# Patient Record
Sex: Female | Born: 1980 | Race: White | Hispanic: No | Marital: Married | Smoking: Never smoker
Health system: Southern US, Community
[De-identification: ages and names within clinical notes are randomized; demographics above are authoritative.]

## PROBLEM LIST (undated history)

## (undated) DIAGNOSIS — K259 Gastric ulcer, unspecified as acute or chronic, without hemorrhage or perforation: Secondary | ICD-10-CM

## (undated) DIAGNOSIS — E119 Type 2 diabetes mellitus without complications: Secondary | ICD-10-CM

## (undated) DIAGNOSIS — E079 Disorder of thyroid, unspecified: Secondary | ICD-10-CM

## (undated) DIAGNOSIS — I10 Essential (primary) hypertension: Secondary | ICD-10-CM

## (undated) HISTORY — PX: TONSILLECTOMY: SUR1361

---

## 2014-03-27 ENCOUNTER — Emergency Department (HOSPITAL_COMMUNITY): Payer: BC Managed Care – PPO

## 2014-03-27 ENCOUNTER — Emergency Department (HOSPITAL_COMMUNITY)
Admission: EM | Admit: 2014-03-27 | Discharge: 2014-03-28 | Disposition: A | Discharge status: Home / Self Care | Payer: BC Managed Care – PPO | Attending: Emergency Medicine | Admitting: Emergency Medicine

## 2014-03-27 ENCOUNTER — Encounter (HOSPITAL_COMMUNITY): Payer: Self-pay

## 2014-03-27 DIAGNOSIS — N200 Calculus of kidney: Secondary | ICD-10-CM | POA: Diagnosis not present

## 2014-03-27 DIAGNOSIS — R000 Tachycardia, unspecified: Secondary | ICD-10-CM | POA: Diagnosis not present

## 2014-03-27 DIAGNOSIS — I10 Essential (primary) hypertension: Secondary | ICD-10-CM | POA: Insufficient documentation

## 2014-03-27 DIAGNOSIS — R739 Hyperglycemia, unspecified: Secondary | ICD-10-CM

## 2014-03-27 DIAGNOSIS — Z791 Long term (current) use of non-steroidal anti-inflammatories (NSAID): Secondary | ICD-10-CM | POA: Diagnosis not present

## 2014-03-27 DIAGNOSIS — N12 Tubulo-interstitial nephritis, not specified as acute or chronic: Secondary | ICD-10-CM

## 2014-03-27 DIAGNOSIS — Z3202 Encounter for pregnancy test, result negative: Secondary | ICD-10-CM | POA: Insufficient documentation

## 2014-03-27 DIAGNOSIS — R42 Dizziness and giddiness: Secondary | ICD-10-CM | POA: Diagnosis not present

## 2014-03-27 DIAGNOSIS — R109 Unspecified abdominal pain: Secondary | ICD-10-CM | POA: Diagnosis present

## 2014-03-27 DIAGNOSIS — N23 Unspecified renal colic: Secondary | ICD-10-CM | POA: Diagnosis not present

## 2014-03-27 DIAGNOSIS — Z79899 Other long term (current) drug therapy: Secondary | ICD-10-CM | POA: Insufficient documentation

## 2014-03-27 DIAGNOSIS — E039 Hypothyroidism, unspecified: Secondary | ICD-10-CM | POA: Diagnosis not present

## 2014-03-27 DIAGNOSIS — E1165 Type 2 diabetes mellitus with hyperglycemia: Secondary | ICD-10-CM | POA: Diagnosis not present

## 2014-03-27 DIAGNOSIS — Z794 Long term (current) use of insulin: Secondary | ICD-10-CM | POA: Insufficient documentation

## 2014-03-27 HISTORY — DX: Type 2 diabetes mellitus without complications: E11.9

## 2014-03-27 HISTORY — DX: Essential (primary) hypertension: I10

## 2014-03-27 HISTORY — DX: Disorder of thyroid, unspecified: E07.9

## 2014-03-27 HISTORY — DX: Gastric ulcer, unspecified as acute or chronic, without hemorrhage or perforation: K25.9

## 2014-03-27 LAB — URINE MICROSCOPIC-ADD ON

## 2014-03-27 LAB — URINALYSIS, ROUTINE W REFLEX MICROSCOPIC
Bilirubin Urine: NEGATIVE
Glucose, UA: >1000 mg/dL — AB
Ketones, ur: NEGATIVE mg/dL
NITRITE: NEGATIVE
PROTEIN: NEGATIVE mg/dL
Specific Gravity, Urine: 1.01 (ref 1.005–1.030)
Urobilinogen, UA: 1 mg/dL (ref 0.0–1.0)
pH: 7.5 (ref 5.0–8.0)

## 2014-03-27 LAB — POC URINE PREG, ED: Preg Test, Ur: NEGATIVE

## 2014-03-27 MED ORDER — SODIUM CHLORIDE 0.9 % IV SOLN
INTRAVENOUS | Status: DC
  Administered 2014-03-28: via INTRAVENOUS

## 2014-03-27 MED ORDER — ONDANSETRON HCL 4 MG/2ML IJ SOLN
4.0000 mg | Freq: Once | INTRAMUSCULAR | Status: AC
  Administered 2014-03-28: 4 mg via INTRAVENOUS

## 2014-03-27 MED ORDER — HYDROMORPHONE HCL 1 MG/ML IJ SOLN
0.5000 mg | Freq: Once | INTRAMUSCULAR | Status: AC
  Administered 2014-03-28: 0.5 mg via INTRAVENOUS

## 2014-03-27 NOTE — ED Provider Notes (Signed)
CSN: [id number]     Arrival date & time 03/27/14  2244 History   First MD Initiated Contact with Patient 03/27/14 2249     Chief Complaint  Patient presents with  . Flank Pain     (Consider location/radiation/quality/duration/timing/severity/associated sxs/prior Treatment) Patient is a 33 y.o. female presenting with flank pain. The history is provided by the patient.  Flank Pain This is a new problem. The current episode started yesterday. The problem occurs constantly. The problem has been gradually worsening. Associated symptoms include abdominal pain, chills, a fever and nausea. Pertinent negatives include no headaches, rash, urinary symptoms or vomiting. Nothing aggravates the symptoms. She has tried NSAIDs for the symptoms. The treatment provided no relief.   Sandra Garner is a 33 y.o. female who presents to the ED with right flank pain. She states that she fell over a month ago and hit the right side of her back and the area that is hurting tonight is in that area. She is not sure that it is related.   Past Medical History  Diagnosis Date  . Diabetes mellitus without complication   . Thyroid disease   . Hypertension   . Multiple gastric ulcers    Past Surgical History  Procedure Laterality Date  . Tonsillectomy     No family history on file. History  Substance Use Topics  . Smoking status: Never Smoker   . Smokeless tobacco: Not on file  . Alcohol Use: No   OB History    No data available     Review of Systems  Constitutional: Positive for fever and chills.  HENT: Negative.   Eyes: Negative for visual disturbance.  Respiratory: Negative.   Cardiovascular: Negative for palpitations.  Gastrointestinal: Positive for nausea, abdominal pain and constipation. Negative for vomiting and diarrhea.  Genitourinary: Positive for flank pain and pelvic pain. Negative for dysuria, urgency, frequency and decreased urine volume.  Musculoskeletal: Positive for back pain.  Skin:  Negative for rash.  Neurological: Positive for light-headedness. Negative for syncope and headaches.  Psychiatric/Behavioral: Negative for confusion. The patient is not nervous/anxious.       Allergies  Review of patient's allergies indicates no known allergies.  Home Medications   Prior to Admission medications   Medication Sig Start Date End Date Taking? Authorizing Provider  citalopram (CELEXA) 40 MG tablet Take 40 mg by mouth daily.   Yes Historical Provider, MD  esomeprazole (NEXIUM) 40 MG capsule Take 40 mg by mouth daily at 12 noon.   Yes Historical Provider, MD  glyBURIDE (DIABETA) 5 MG tablet Take 5 mg by mouth 2 (two) times daily with a meal.   Yes Historical Provider, MD  insulin aspart (NOVOLOG) 100 UNIT/ML injection Inject into the skin 3 (three) times daily before meals.   Yes Historical Provider, MD  levothyroxine (SYNTHROID, LEVOTHROID) 150 MCG tablet Take 150 mcg by mouth daily before breakfast.   Yes Historical Provider, MD  LORazepam (ATIVAN) 0.5 MG tablet Take 0.5 mg by mouth 2 (two) times daily.   Yes Historical Provider, MD  metFORMIN (GLUCOPHAGE) 500 MG tablet Take 1,000 mg by mouth 2 (two) times daily with a meal.   Yes Historical Provider, MD  naproxen (NAPROSYN) 250 MG tablet Take by mouth 2 (two) times daily with a meal.   Yes Historical Provider, MD  sitaGLIPtin (JANUVIA) 25 MG tablet Take 25 mg by mouth daily.   Yes Historical Provider, MD   BP 152/98 mmHg  Pulse 114  Temp(Src) 101.7 F (38.7 C) (  Oral)  Resp 20  Ht 5\' 10"  (1.778 m)  Wt 260 lb (117.935 kg)  BMI 37.31 kg/m2  SpO2 99%  LMP 03/20/2014 (Approximate) Physical Exam  Constitutional: She is oriented to person, place, and time. She appears well-developed and well-nourished.  HENT:  Head: Normocephalic.  Eyes: EOM are normal.  Neck: Neck supple.  Cardiovascular: Regular rhythm.  Tachycardia present.   Pulmonary/Chest: Effort normal and breath sounds normal.  Abdominal: Soft. Bowel sounds  are normal. There is tenderness in the right lower quadrant and suprapubic area. There is no rebound and no guarding.  Right flank pain  Musculoskeletal: Normal range of motion.  Neurological: She is alert and oriented to person, place, and time. No cranial nerve deficit.  Skin: Skin is warm and dry.  Psychiatric: She has a normal mood and affect. Her behavior is normal.  Nursing note and vitals reviewed.  IV NS, Dilaudid 0.5mg , Zofran 4mg . CT renal  ED Course  Procedures  Results for orders placed or performed during the hospital encounter of 03/27/14 (from the past 24 hour(s))  Urinalysis, Routine w reflex microscopic     Status: Abnormal   Collection Time: 03/27/14 11:27 PM  Result Value Ref Range   Color, Urine YELLOW YELLOW   APPearance HAZY (A) CLEAR   Specific Gravity, Urine 1.010 1.005 - 1.030   pH 7.5 5.0 - 8.0   Glucose, UA >1000 (A) NEGATIVE mg/dL   Hgb urine dipstick TRACE (A) NEGATIVE   Bilirubin Urine NEGATIVE NEGATIVE   Ketones, ur NEGATIVE NEGATIVE mg/dL   Protein, ur NEGATIVE NEGATIVE mg/dL   Urobilinogen, UA 1.0 0.0 - 1.0 mg/dL   Nitrite NEGATIVE NEGATIVE   Leukocytes, UA SMALL (A) NEGATIVE  Urine microscopic-add on     Status: Abnormal   Collection Time: 03/27/14 11:27 PM  Result Value Ref Range   Squamous Epithelial / LPF MANY (A) RARE   WBC, UA 11-20 <3 WBC/hpf   RBC / HPF 3-6 <3 RBC/hpf   Bacteria, UA RARE RARE  POC urine preg, [facility])     Status: None   Collection Time: 03/27/14 11:29 PM  Result Value Ref Range   Preg Test, Ur NEGATIVE NEGATIVE  CBC with Differential     Status: Abnormal   Collection Time: 03/27/14 11:49 PM  Result Value Ref Range   WBC 7.6 4.0 - 10.5 K/uL   RBC 4.08 3.87 - 5.11 MIL/uL   Hemoglobin 10.7 (L) 12.0 - 15.0 g/dL   HCT 16.132.7 (L) 09.636.0 - 04.546.0 %   MCV 80.1 78.0 - 100.0 fL   MCH 26.2 26.0 - 34.0 pg   MCHC 32.7 30.0 - 36.0 g/dL   RDW 40.914.0 81.111.5 - 91.415.5 %   Platelets 125 (L) 150 - 400 K/uL   Neutrophils Relative % 61  43 - 77 %   Neutro Abs 4.6 1.7 - 7.7 K/uL   Lymphocytes Relative 32 12 - 46 %   Lymphs Abs 2.4 0.7 - 4.0 K/uL   Monocytes Relative 5 3 - 12 %   Monocytes Absolute 0.4 0.1 - 1.0 K/uL   Eosinophils Relative 2 0 - 5 %   Eosinophils Absolute 0.1 0.0 - 0.7 K/uL   Basophils Relative 0 0 - 1 %   Basophils Absolute 0.0 0.0 - 0.1 K/uL  Comprehensive metabolic panel     Status: Abnormal   Collection Time: 03/27/14 11:49 PM  Result Value Ref Range   Sodium 136 (L) 137 - 147 mEq/L  Potassium 4.0 3.7 - 5.3 mEq/L   Chloride 99 96 - 112 mEq/L   CO2 25 19 - 32 mEq/L   Glucose, Bld 254 (H) 70 - 99 mg/dL   BUN 7 6 - 23 mg/dL   Creatinine, Ser 4.090.62 0.50 - 1.10 mg/dL   Calcium 9.4 8.4 - 81.110.5 mg/dL   Total Protein 7.7 6.0 - 8.3 g/dL   Albumin 3.3 (L) 3.5 - 5.2 g/dL   AST 22 0 - 37 U/L   ALT 25 0 - 35 U/L   Alkaline Phosphatase 94 39 - 117 U/L   Total Bilirubin 0.4 0.3 - 1.2 mg/dL   GFR calc non Af Amer >90 >90 mL/min   GFR calc Af Amer >90 >90 mL/min   Anion gap 12 5 - 15     MDM  Patient awaiting CT results. Dr. Bebe ShaggyWickline assumed care of the patient @ [id number].     [address] Resorts[address] Cyara Devoto, NP 03/28/14 [id number]  Joya Gaskinsonald W Wickline, MD 03/28/14 [phone]

## 2014-03-27 NOTE — ED Notes (Signed)
Pt states she fell a month and a half ago, has been having pain to right lower back and right flank, tonight also having a fever.

## 2014-03-28 LAB — CBC WITH DIFFERENTIAL/PLATELET
BASOS ABS: 0 10*3/uL (ref 0.0–0.1)
Basophils Relative: 0 % (ref 0–1)
EOS PCT: 2 % (ref 0–5)
Eosinophils Absolute: 0.1 10*3/uL (ref 0.0–0.7)
HEMATOCRIT: 32.7 % — AB (ref 36.0–46.0)
Hemoglobin: 10.7 g/dL — ABNORMAL LOW (ref 12.0–15.0)
Lymphocytes Relative: 32 % (ref 12–46)
Lymphs Abs: 2.4 10*3/uL (ref 0.7–4.0)
MCH: 26.2 pg (ref 26.0–34.0)
MCHC: 32.7 g/dL (ref 30.0–36.0)
MCV: 80.1 fL (ref 78.0–100.0)
MONO ABS: 0.4 10*3/uL (ref 0.1–1.0)
Monocytes Relative: 5 % (ref 3–12)
Neutro Abs: 4.6 10*3/uL (ref 1.7–7.7)
Neutrophils Relative %: 61 % (ref 43–77)
PLATELETS: 125 10*3/uL — AB (ref 150–400)
RBC: 4.08 MIL/uL (ref 3.87–5.11)
RDW: 14 % (ref 11.5–15.5)
WBC: 7.6 10*3/uL (ref 4.0–10.5)

## 2014-03-28 LAB — COMPREHENSIVE METABOLIC PANEL
ALBUMIN: 3.3 g/dL — AB (ref 3.5–5.2)
ALT: 25 U/L (ref 0–35)
AST: 22 U/L (ref 0–37)
Alkaline Phosphatase: 94 U/L (ref 39–117)
Anion gap: 12 (ref 5–15)
BUN: 7 mg/dL (ref 6–23)
CALCIUM: 9.4 mg/dL (ref 8.4–10.5)
CO2: 25 mEq/L (ref 19–32)
CREATININE: 0.62 mg/dL (ref 0.50–1.10)
Chloride: 99 mEq/L (ref 96–112)
GFR calc Af Amer: >90 mL/min (ref >90)
GFR calc non Af Amer: >90 mL/min (ref >90)
Glucose, Bld: 254 mg/dL — ABNORMAL HIGH (ref 70–99)
Potassium: 4 mEq/L (ref 3.7–5.3)
Sodium: 136 mEq/L — ABNORMAL LOW (ref 137–147)
Total Bilirubin: 0.4 mg/dL (ref 0.3–1.2)
Total Protein: 7.7 g/dL (ref 6.0–8.3)

## 2014-03-28 MED ORDER — DEXTROSE 5 % IV SOLN
1.0000 g | Freq: Once | INTRAVENOUS | Status: AC
  Administered 2014-03-28: 1 g via INTRAVENOUS

## 2014-03-30 LAB — URINE CULTURE: Colony Count: 75000

## 2014-03-31 ENCOUNTER — Telehealth (HOSPITAL_COMMUNITY): Payer: Self-pay

## 2014-03-31 NOTE — ED Notes (Signed)
Post ED Visit - Positive Culture Follow-up: Successful Patient Follow-Up  Culture assessed and recommendations reviewed by: []  Wes Dulaney, Pharm.D., BCPS []  Celedonio MiyamotoJeremy Garner, Pharm.D., BCPS [x]  Georgina PillionElizabeth Garner, Pharm.D., BCPS []  FaisonMinh Garner, [address] BoulevardPharm.D., BCPS, AAHIVP []  Estella HuskMichelle Garner, Pharm.D., BCPS, AAHIVP []  Red ChristiansSamson Garner, Pharm.D. []  Sandra Garner, [place].  Positive urine culture  [x]  Patient discharged without antimicrobial prescription and treatment is now indicated []  Organism is resistant to prescribed ED discharge antimicrobial []  Patient with positive blood cultures  Changes discussed with ED provider: Emilia BeckKaitlyn Garner New antibiotic prescription Bactrim DS take 1 tab bid x 7 days [facility] [place] [phone]  Contacted patient, date 12/7, time 1330   Ashley JacobsFesterman, Asjia Berrios C 03/31/2014, 1:28 PM

## 2014-03-31 NOTE — Progress Notes (Signed)
ED Antimicrobial Stewardship Positive Culture Follow Up   Sandra Garner is an 33 y.o. female [facility] with a chief complaint of flank pain + fever Chief Complaint  Patient presents with  . Flank Pain    Recent Results (from the past 720 hour(s))  Urine culture     Status: None   Collection Time: 03/27/14 11:27 PM  Result Value Ref Range Status   Specimen Description URINE, CLEAN CATCH  Final   Special Requests NONE  Final   Culture  Setup Time   Final    03/28/2014 13:28 Performed at [facility]    Colony Count   Final    75,000 COLONIES/ML [facility]    Culture   Final    STAPHYLOCOCCUS SPECIES (COAGULASE NEGATIVE) Note: RIFAMPIN AND GENTAMICIN SHOULD NOT BE USED AS SINGLE DRUGS FOR TREATMENT OF STAPH INFECTIONS. Performed at [facility]    Report Status 03/30/2014 FINAL  Final   Organism ID, Bacteria STAPHYLOCOCCUS SPECIES (COAGULASE NEGATIVE)  Final      Susceptibility   Staphylococcus species (coagulase negative) - MIC*    GENTAMICIN <=0.5 SENSITIVE Sensitive     LEVOFLOXACIN 0.5 SENSITIVE Sensitive     NITROFURANTOIN <=16 SENSITIVE Sensitive     OXACILLIN SENSITIVE      PENICILLIN >=0.5 RESISTANT Resistant     RIFAMPIN <=0.5 SENSITIVE Sensitive     TRIMETH/SULFA <=10 SENSITIVE Sensitive     VANCOMYCIN <=0.5 SENSITIVE Sensitive     TETRACYCLINE <=1 SENSITIVE Sensitive     * STAPHYLOCOCCUS SPECIES (COAGULASE NEGATIVE)    [x]  Patient discharged originally without antimicrobial agent and treatment is now indicated  New antibiotic prescription: Bactrim DS 1 tab po bid x 7 days  ED Provider: Emilia BeckKaitlyn Szekalski, PA-C  Rolley SimsMartin, Inmer Nix Ann 03/31/2014, 11:29 AM Infectious Diseases Pharmacist Phone# [phone]

## 2014-05-19 ENCOUNTER — Encounter: Payer: Self-pay | Admitting: Obstetrics & Gynecology

## 2014-05-20 ENCOUNTER — Encounter: Payer: Self-pay | Admitting: *Deleted

## 2014-05-21 ENCOUNTER — Encounter: Payer: Self-pay | Admitting: Obstetrics & Gynecology

## 2014-05-26 ENCOUNTER — Encounter: Payer: Self-pay | Admitting: Obstetrics & Gynecology

## 2014-05-26 ENCOUNTER — Ambulatory Visit (INDEPENDENT_AMBULATORY_CARE_PROVIDER_SITE_OTHER): Payer: BLUE CROSS/BLUE SHIELD | Admitting: Obstetrics & Gynecology

## 2014-05-26 VITALS — BP 130/80 | Ht 69.0 in | Wt 268.0 lb

## 2014-05-26 DIAGNOSIS — N970 Female infertility associated with anovulation: Secondary | ICD-10-CM

## 2014-05-26 DIAGNOSIS — E282 Polycystic ovarian syndrome: Secondary | ICD-10-CM

## 2014-05-26 MED ORDER — MEGESTROL ACETATE 40 MG PO TABS: 40.0000 mg | ORAL_TABLET | Freq: Every day | ORAL | Status: AC

## 2014-05-26 NOTE — Progress Notes (Signed)
Patient ID: Sandra PaceMichelle Magno, female   DOB: 12/14/1980, 34 y.o.   MRN: [id number] New patient referral visit  Chief Complaint  Patient presents with  . Referral    irregular cycle.    HPI Pt with long standing history of very irregular menses, never been pregnant Obese with Type II diabetes When she has menses they are long and heavy and painful  ROS No burning with urination, frequency or urgency No nausea, vomiting or diarrhea Nor fever chills or other constitutional symptoms   Blood pressure 130/80, height 5\' 9"  (1.753 m), weight 268 lb (121.564 kg), last menstrual period 05/22/2014.  EXAM Assessment/Plan:  PCOS/chronic anovulation:  Megestrol for endometrial synchronization Sonogram the ocp then maybe IUD

## 2014-06-24 ENCOUNTER — Ambulatory Visit: Payer: BLUE CROSS/BLUE SHIELD | Admitting: Obstetrics & Gynecology

## 2014-06-24 ENCOUNTER — Other Ambulatory Visit: Payer: BLUE CROSS/BLUE SHIELD

## 2015-05-16 IMAGING — CT CT RENAL STONE PROTOCOL
2 of 4 series · 16 of 46 positions shown, 18 images · non-contrast
Comparison: None.

CLINICAL DATA: Acute onset of right flank pain and fever. Status
post fall down steps outside of home 1 month ago, with left-sided
abdominal bruising. Initial encounter.

EXAM:
CT ABDOMEN AND PELVIS WITHOUT CONTRAST
TECHNIQUE: Multidetector CT imaging of the abdomen and pelvis was performed
following the standard protocol without IV contrast.

[Series 2: standard/full over (age)lbs 5.0 · axial · 0.90mm/px · z∈[-467,-17]mm · 13 of 100 slices shown, 15 images]
[im 5/100  soft-tissue]
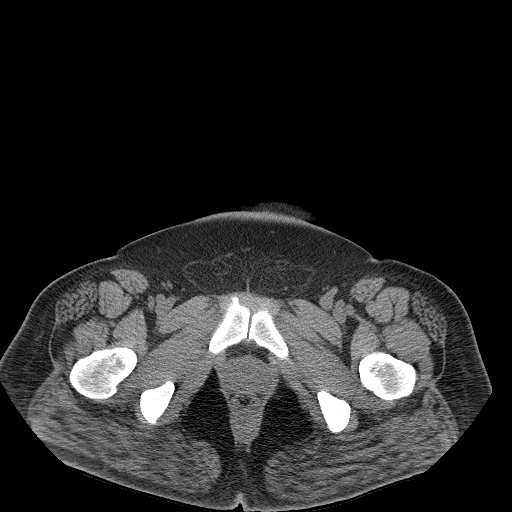
[im 5/100  bone]
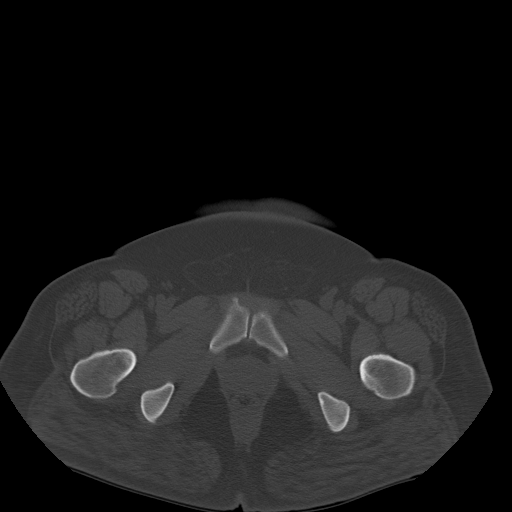
[im 13/100  soft-tissue]
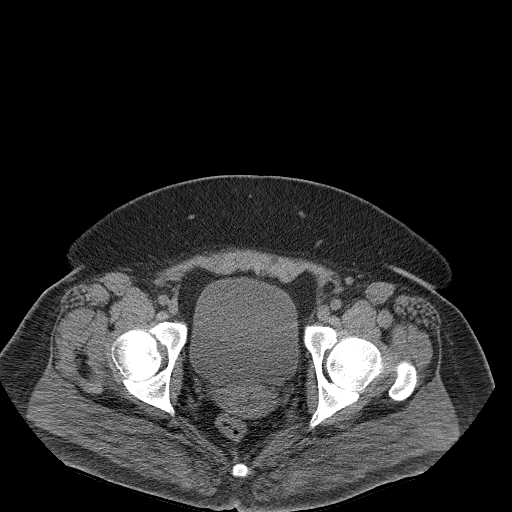
[im 21/100  soft-tissue]
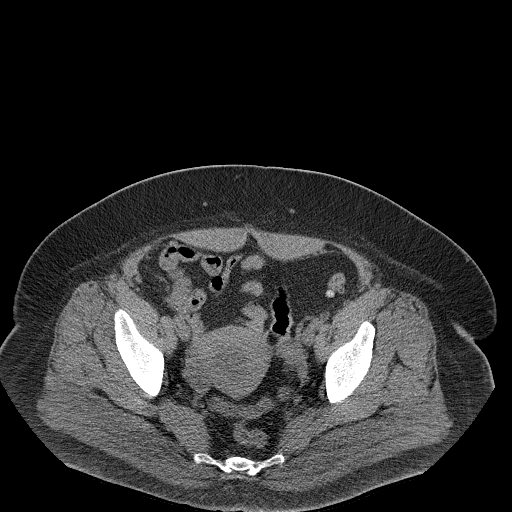
[im 29/100  soft-tissue]
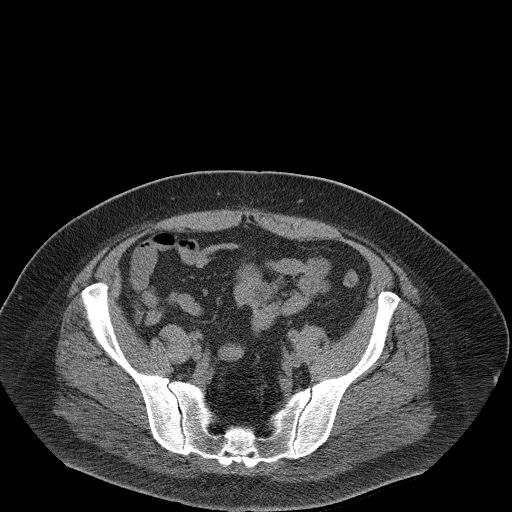
[im 34/100  soft-tissue]
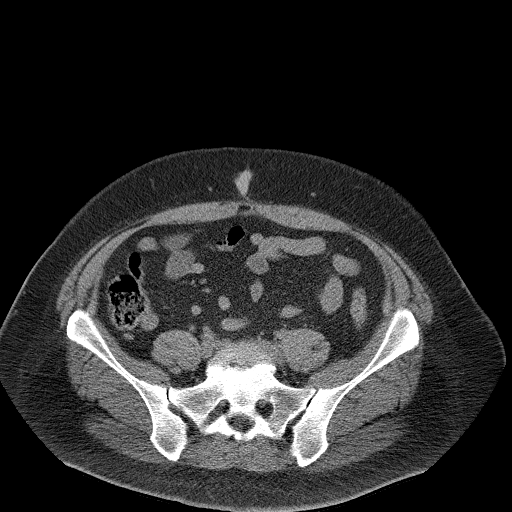
[im 42/100  soft-tissue]
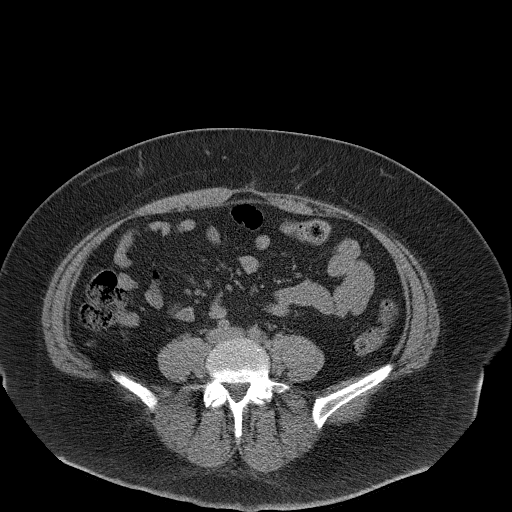
[im 50/100  soft-tissue]
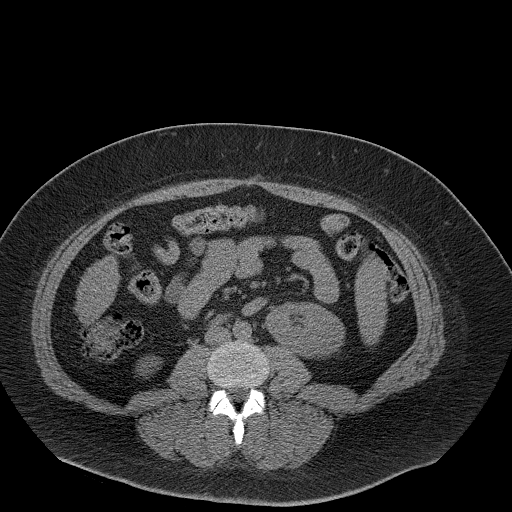
[im 58/100  soft-tissue]
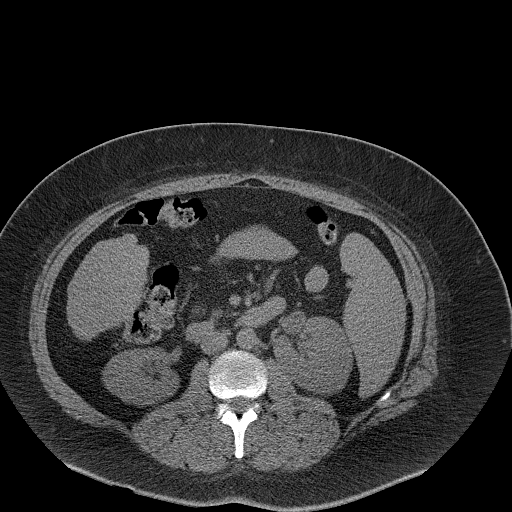
[im 67/100  soft-tissue]
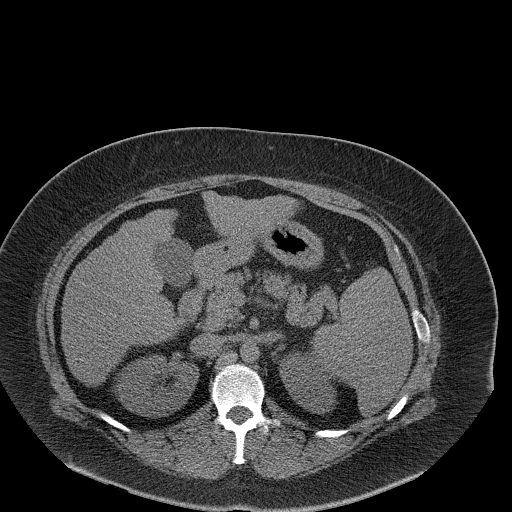
[im 67/100  bone]
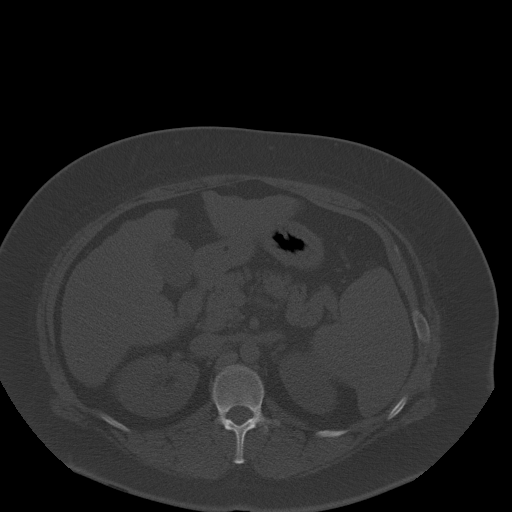
[im 71/100  soft-tissue]
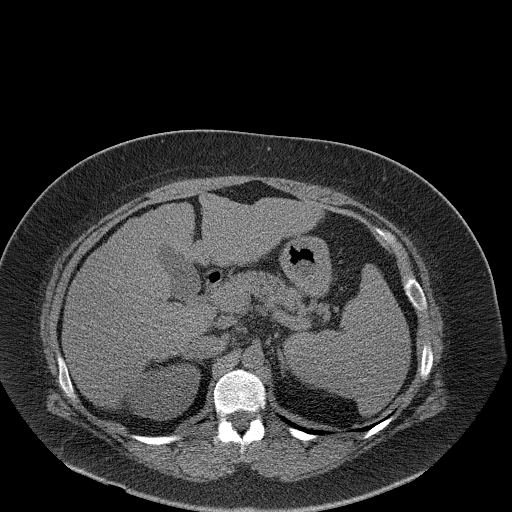
[im 79/100  soft-tissue]
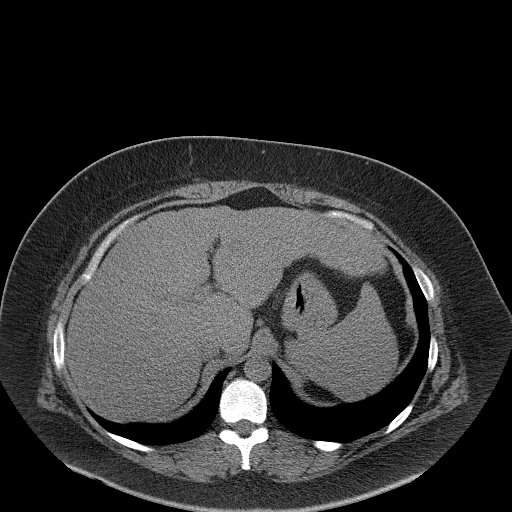
[im 87/100  soft-tissue]
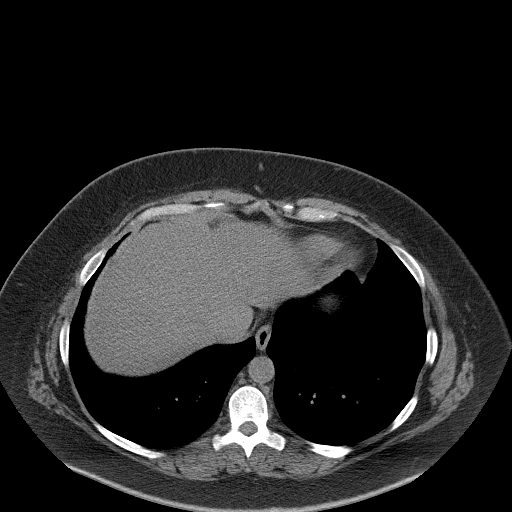
[im 95/100  soft-tissue]
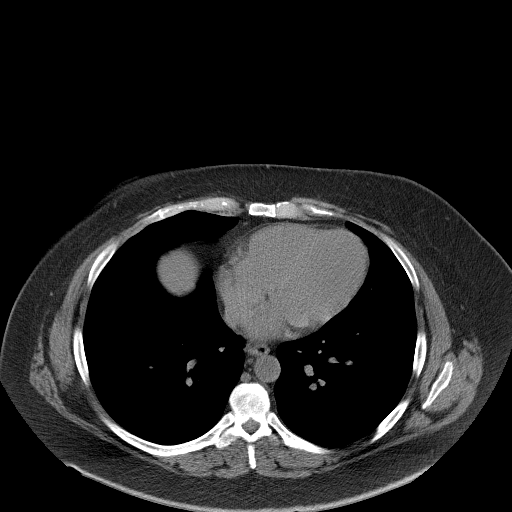

[Series 3: mpr coronal · coronal · 0.98mm/px · 3 of 124 slices shown]
[im 42/124  soft-tissue]
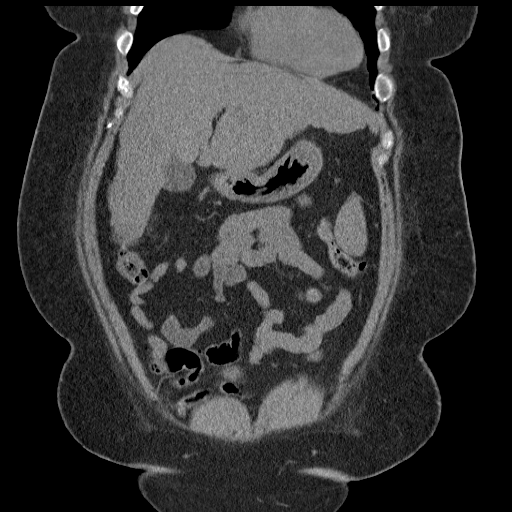
[im 55/124  soft-tissue]
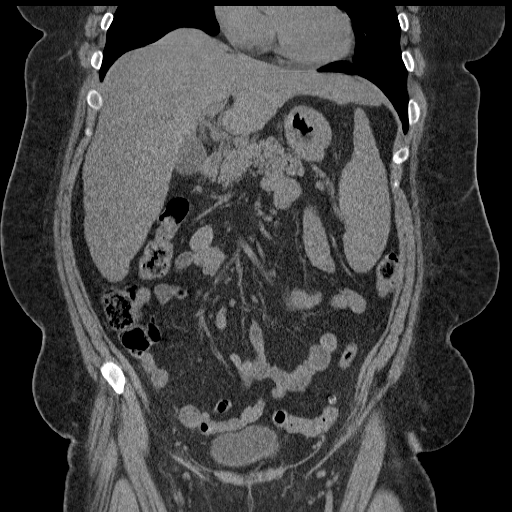
[im 69/124  soft-tissue]
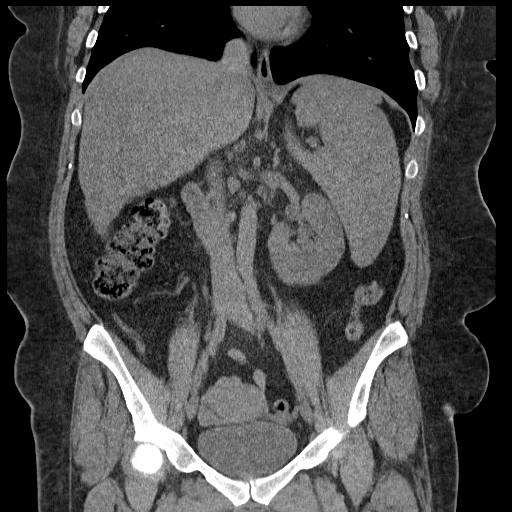

[16 of 46 positions shown; findings below may reference images not displayed]

FINDINGS: The visualized lung bases are clear.

The nodular contour of the liver is compatible with hepatic
cirrhosis. No focal mass is identified, though evaluation for mass
is limited without contrast. The spleen is enlarged, measuring
cm in length. The gallbladder is within normal limits. The pancreas
and adrenal glands are unremarkable.

The kidneys are unremarkable in appearance. There is no evidence of
hydronephrosis. No renal or ureteral stones are seen. No perinephric
stranding is appreciated.

No free fluid is identified. The small bowel is unremarkable in
appearance. The stomach is within normal limits. No acute vascular
abnormalities are seen.

Scattered mesenteric nodes remain normal in size. A splenule is seen
anterior to the spleen.

The appendix is normal in caliber, without evidence for
appendicitis. The colon is largely decompressed. Minimal
diverticulosis is noted along the descending and proximal sigmoid
colon, without evidence of diverticulitis.

The bladder is mildly distended and grossly unremarkable. The uterus
demonstrates somewhat prominent decreased attenuation at the
endometrium and cervix. This likely reflects the normal secretory
phase of the ovulatory cycle, though correlation with Pap smear
would be helpful. The ovaries are relatively symmetric. No
suspicious adnexal masses are seen. No inguinal lymphadenopathy is
seen.

No acute osseous abnormalities are identified.
IMPRESSION: 1. No acute abnormality seen within the abdomen or pelvis. No
evidence of hydronephrosis. No renal or ureteral stones seen.
2. Hepatic cirrhosis. This is unusual for the patient's age. Would
follow-up with labs for evidence of hepatitis infection.
3. Somewhat prominent decreased attenuation noted at the endometrium
and cervix. This likely reflects the normal secretory phase of the
ovulatory cycle, though correlation with Pap smear would be helpful.
4. Splenomegaly noted.
5. Minimal diverticulosis along the descending and proximal sigmoid
colon, without evidence of diverticulitis.
